# Patient Record
Sex: Male | Born: 1970 | Race: Black or African American | Hispanic: No | Marital: Single | Smoking: Current every day smoker
Health system: Southern US, Community
[De-identification: ages and names within clinical notes are randomized; demographics above are authoritative.]

---

## 2005-06-20 ENCOUNTER — Emergency Department (HOSPITAL_COMMUNITY): Admission: EM | Admit: 2005-06-20 | Discharge: 2005-06-20 | Payer: Self-pay | Admitting: Family Medicine

## 2007-04-06 ENCOUNTER — Emergency Department (HOSPITAL_COMMUNITY): Admission: EM | Admit: 2007-04-06 | Discharge: 2007-04-06 | Payer: Self-pay | Admitting: Emergency Medicine

## 2012-10-27 ENCOUNTER — Emergency Department (HOSPITAL_COMMUNITY)
Admission: EM | Admit: 2012-10-27 | Discharge: 2012-10-27 | Disposition: A | Discharge status: Home / Self Care | Payer: Self-pay | Attending: Emergency Medicine | Admitting: Emergency Medicine

## 2012-10-27 ENCOUNTER — Emergency Department (HOSPITAL_COMMUNITY): Payer: Self-pay

## 2012-10-27 ENCOUNTER — Encounter (HOSPITAL_COMMUNITY): Payer: Self-pay | Admitting: Emergency Medicine

## 2012-10-27 DIAGNOSIS — S91309A Unspecified open wound, unspecified foot, initial encounter: Secondary | ICD-10-CM | POA: Insufficient documentation

## 2012-10-27 DIAGNOSIS — Z23 Encounter for immunization: Secondary | ICD-10-CM | POA: Insufficient documentation

## 2012-10-27 DIAGNOSIS — Y939 Activity, unspecified: Secondary | ICD-10-CM | POA: Insufficient documentation

## 2012-10-27 DIAGNOSIS — F17200 Nicotine dependence, unspecified, uncomplicated: Secondary | ICD-10-CM | POA: Insufficient documentation

## 2012-10-27 DIAGNOSIS — S91311A Laceration without foreign body, right foot, initial encounter: Secondary | ICD-10-CM

## 2012-10-27 MED ORDER — LIDOCAINE-EPINEPHRINE 2 %-1:100000 IJ SOLN
20.0000 mL | Freq: Once | INTRAMUSCULAR | Status: AC
  Administered 2012-10-27: 20 mL

## 2012-10-27 MED ORDER — FENTANYL CITRATE 0.05 MG/ML IJ SOLN
100.0000 ug | Freq: Once | INTRAMUSCULAR | Status: AC
  Administered 2012-10-27: 100 ug via NASAL
  Filled 2012-10-27: qty 2

## 2012-10-27 MED ORDER — OXYCODONE-ACETAMINOPHEN 5-325 MG PO TABS
2.0000 | ORAL_TABLET | Freq: Once | ORAL | Status: AC
  Administered 2012-10-27: 2 via ORAL
  Filled 2012-10-27: qty 2

## 2012-10-27 MED ORDER — CEPHALEXIN 500 MG PO CAPS: ORAL_CAPSULE | ORAL | Status: DC

## 2012-10-27 MED ORDER — TETANUS-DIPHTH-ACELL PERTUSSIS 5-2.5-18.5 LF-MCG/0.5 IM SUSP
0.5000 mL | Freq: Once | INTRAMUSCULAR | Status: AC
  Administered 2012-10-27: 0.5 mL via INTRAMUSCULAR
  Filled 2012-10-27: qty 0.5

## 2012-10-27 MED ORDER — OXYCODONE-ACETAMINOPHEN 5-325 MG PO TABS: 2.0000 | ORAL_TABLET | Freq: Four times a day (QID) | ORAL | Status: DC | PRN

## 2012-10-27 NOTE — ED Notes (Signed)
PA's at bedside with suture cart
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
## 2012-10-27 NOTE — ED Notes (Signed)
Suture cart at bedside 

## 2012-10-27 NOTE — ED Provider Notes (Signed)
LACERATION REPAIR Performed by: Antony Madura Authorized by: Antony Madura Consent: Verbal consent obtained. Risks and benefits: risks, benefits and alternatives were discussed Consent given by: patient Patient identity confirmed: provided demographic data Prepped and Draped in normal sterile fashion Wound explored  Laceration Location: medial right foot  Laceration Length: approx 18 cm  No Foreign Bodies seen or palpated  Anesthesia: local infiltration  Local anesthetic: lidocaine 2% with epinephrine  Anesthetic total: 20 ml  Irrigation method: irrigated with sterile water Amount of cleaning: standard  Skin closure: non-absorbable ethilon 3-0 black nylon monofilament sutures  Number of sutures: 14  Technique: laceration was closed with horizontal mattress (4) and simple interrupted (10) sutures using a clean technique.  Patient tolerance: Patient tolerated the procedure well with no immediate complications.   Antony Madura, PA-C 10/27/12 2153  Antony Madura, PA-C 10/27/12 2156

## 2012-11-04 NOTE — ED Provider Notes (Signed)
Medical screening examination/treatment/procedure(s) were conducted as a shared visit with non-physician practitioner(s) and myself.  I personally evaluated the patient during the encounter  Hurman Horn, MD 11/04/12 [phone]

## 2012-11-12 ENCOUNTER — Encounter (HOSPITAL_COMMUNITY): Payer: Self-pay | Admitting: *Deleted

## 2012-11-12 ENCOUNTER — Emergency Department (HOSPITAL_COMMUNITY)
Admission: EM | Admit: 2012-11-12 | Discharge: 2012-11-13 | Disposition: A | Discharge status: Home / Self Care | Payer: Self-pay | Attending: Emergency Medicine | Admitting: Emergency Medicine

## 2012-11-12 ENCOUNTER — Emergency Department (HOSPITAL_COMMUNITY): Payer: Self-pay

## 2012-11-12 DIAGNOSIS — S91309A Unspecified open wound, unspecified foot, initial encounter: Secondary | ICD-10-CM | POA: Insufficient documentation

## 2012-11-12 DIAGNOSIS — T798XXA Other early complications of trauma, initial encounter: Secondary | ICD-10-CM

## 2012-11-12 DIAGNOSIS — F17200 Nicotine dependence, unspecified, uncomplicated: Secondary | ICD-10-CM | POA: Insufficient documentation

## 2012-11-12 MED ORDER — CLINDAMYCIN PHOSPHATE 600 MG/50ML IV SOLN
600.0000 mg | Freq: Once | INTRAVENOUS | Status: AC
  Administered 2012-11-13: 600 mg via INTRAVENOUS
  Filled 2012-11-12: qty 50
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
## 2012-11-12 NOTE — ED Notes (Signed)
Pt complain of right ankle pain 7/10. Pts foot is wrapped in kerlex.

## 2012-11-12 NOTE — ED Notes (Signed)
MD at bedside. 

## 2012-11-13 LAB — CBC WITH DIFFERENTIAL/PLATELET
Basophils Absolute: 0 10*3/uL (ref 0.0–0.1)
Basophils Relative: 0 % (ref 0–1)
Eosinophils Absolute: 0.2 10*3/uL (ref 0.0–0.7)
Hemoglobin: 13.6 g/dL (ref 13.0–17.0)
MCH: 31.3 pg (ref 26.0–34.0)
MCHC: 33.1 g/dL (ref 30.0–36.0)
Neutro Abs: 4.9 10*3/uL (ref 1.7–7.7)
Neutrophils Relative %: 60 % (ref 43–77)
Platelets: 255 10*3/uL (ref 150–400)
RDW: 14.1 % (ref 11.5–15.5)

## 2012-11-13 LAB — BASIC METABOLIC PANEL
Calcium: 8.9 mg/dL (ref 8.4–10.5)
GFR calc Af Amer: >90 mL/min (ref >90)
GFR calc non Af Amer: >90 mL/min (ref >90)
Potassium: 3.6 mEq/L (ref 3.5–5.1)
Sodium: 137 mEq/L (ref 135–145)

## 2012-11-13 LAB — SEDIMENTATION RATE: Sed Rate: 35 mm/hr — ABNORMAL HIGH (ref 0–16)

## 2012-11-13 MED ORDER — OXYCODONE-ACETAMINOPHEN 5-325 MG PO TABS: 2.0000 | ORAL_TABLET | Freq: Four times a day (QID) | ORAL | Status: DC | PRN

## 2012-11-13 MED ORDER — HYDROMORPHONE HCL PF 1 MG/ML IJ SOLN
1.0000 mg | Freq: Once | INTRAMUSCULAR | Status: AC
  Administered 2012-11-13: 1 mg via INTRAVENOUS
  Filled 2012-11-13: qty 1

## 2012-11-13 MED ORDER — CLINDAMYCIN HCL 150 MG PO CAPS: 300.0000 mg | ORAL_CAPSULE | Freq: Four times a day (QID) | ORAL | Status: DC

## 2012-11-13 MED ORDER — MORPHINE SULFATE 4 MG/ML IJ SOLN
4.0000 mg | Freq: Once | INTRAMUSCULAR | Status: AC
  Administered 2012-11-13: 4 mg via INTRAVENOUS
  Filled 2012-11-13: qty 1
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
## 2012-11-19 LAB — CULTURE, BLOOD (ROUTINE X 2)
Culture: NO GROWTH
Special Requests: NORMAL

## 2012-11-28 ENCOUNTER — Encounter (HOSPITAL_BASED_OUTPATIENT_CLINIC_OR_DEPARTMENT_OTHER): Payer: Self-pay | Attending: General Surgery

## 2012-11-28 DIAGNOSIS — S81809A Unspecified open wound, unspecified lower leg, initial encounter: Secondary | ICD-10-CM | POA: Insufficient documentation

## 2012-11-28 DIAGNOSIS — F17200 Nicotine dependence, unspecified, uncomplicated: Secondary | ICD-10-CM | POA: Insufficient documentation

## 2012-11-28 DIAGNOSIS — S81009A Unspecified open wound, unspecified knee, initial encounter: Secondary | ICD-10-CM | POA: Insufficient documentation
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
## 2012-12-07 ENCOUNTER — Other Ambulatory Visit (HOSPITAL_COMMUNITY): Payer: Self-pay | Admitting: General Surgery

## 2012-12-07 DIAGNOSIS — I872 Venous insufficiency (chronic) (peripheral): Secondary | ICD-10-CM

## 2012-12-07 DIAGNOSIS — T07XXXA Unspecified multiple injuries, initial encounter: Secondary | ICD-10-CM

## 2012-12-12 ENCOUNTER — Ambulatory Visit (HOSPITAL_COMMUNITY)
Admission: RE | Admit: 2012-12-12 | Discharge: 2012-12-12 | Disposition: A | Discharge status: Home / Self Care | Payer: Self-pay | Source: Ambulatory Visit | Attending: Cardiovascular Disease | Admitting: Cardiovascular Disease

## 2012-12-12 DIAGNOSIS — T07XXXA Unspecified multiple injuries, initial encounter: Secondary | ICD-10-CM

## 2012-12-12 DIAGNOSIS — T148XXA Other injury of unspecified body region, initial encounter: Secondary | ICD-10-CM | POA: Insufficient documentation

## 2012-12-12 DIAGNOSIS — X58XXXA Exposure to other specified factors, initial encounter: Secondary | ICD-10-CM | POA: Insufficient documentation

## 2012-12-12 NOTE — Progress Notes (Signed)
Arterial Duplex Completed. Ngozi Alvidrez D  

## 2012-12-24 ENCOUNTER — Encounter (HOSPITAL_COMMUNITY): Payer: Self-pay

## 2012-12-26 ENCOUNTER — Encounter (HOSPITAL_BASED_OUTPATIENT_CLINIC_OR_DEPARTMENT_OTHER): Payer: Self-pay | Attending: General Surgery

## 2012-12-26 DIAGNOSIS — T8189XA Other complications of procedures, not elsewhere classified, initial encounter: Secondary | ICD-10-CM | POA: Insufficient documentation

## 2012-12-26 DIAGNOSIS — Y838 Other surgical procedures as the cause of abnormal reaction of the patient, or of later complication, without mention of misadventure at the time of the procedure: Secondary | ICD-10-CM | POA: Insufficient documentation

## 2013-01-30 ENCOUNTER — Encounter (HOSPITAL_BASED_OUTPATIENT_CLINIC_OR_DEPARTMENT_OTHER): Payer: No Typology Code available for payment source | Attending: General Surgery

## 2017-09-28 ENCOUNTER — Ambulatory Visit (INDEPENDENT_AMBULATORY_CARE_PROVIDER_SITE_OTHER): Payer: No Typology Code available for payment source

## 2017-09-28 ENCOUNTER — Encounter: Payer: Self-pay | Admitting: Urgent Care

## 2017-09-28 ENCOUNTER — Ambulatory Visit (INDEPENDENT_AMBULATORY_CARE_PROVIDER_SITE_OTHER): Payer: No Typology Code available for payment source | Admitting: Urgent Care

## 2017-09-28 VITALS — BP 110/70 | HR 102 | Temp 99.2°F | Resp 18 | Ht 72.0 in | Wt 294.2 lb

## 2017-09-28 DIAGNOSIS — R070 Pain in throat: Secondary | ICD-10-CM

## 2017-09-28 DIAGNOSIS — R05 Cough: Secondary | ICD-10-CM

## 2017-09-28 DIAGNOSIS — R059 Cough, unspecified: Secondary | ICD-10-CM

## 2017-09-28 DIAGNOSIS — F17200 Nicotine dependence, unspecified, uncomplicated: Secondary | ICD-10-CM

## 2017-09-28 DIAGNOSIS — R5383 Other fatigue: Secondary | ICD-10-CM

## 2017-09-28 LAB — POCT INFLUENZA A/B
INFLUENZA A, POC: NEGATIVE
INFLUENZA B, POC: NEGATIVE

## 2017-09-28 MED ORDER — HYDROCODONE-HOMATROPINE 5-1.5 MG/5ML PO SYRP: 5.0000 mL | ORAL_SOLUTION | Freq: Every evening | ORAL | 0 refills | Status: DC | PRN

## 2017-09-28 MED ORDER — PSEUDOEPHEDRINE HCL ER 120 MG PO TB12: 120.0000 mg | ORAL_TABLET | Freq: Two times a day (BID) | ORAL | 3 refills | Status: DC

## 2017-09-28 MED ORDER — BENZONATATE 100 MG PO CAPS: 100.0000 mg | ORAL_CAPSULE | Freq: Three times a day (TID) | ORAL | 0 refills | Status: DC | PRN

## 2017-09-28 NOTE — Patient Instructions (Addendum)
Cough, Adult A cough helps to clear your throat and lungs. A cough may last only 2-3 weeks (acute), or it may last longer than 8 weeks (chronic). Many different things can cause a cough. A cough may be a sign of an illness or another medical condition. Follow these instructions at home:  Pay attention to any changes in your cough.  Take medicines only as told by your doctor. ? If you were prescribed an antibiotic medicine, take it as told by your doctor. Do not stop taking it even if you start to feel better. ? Talk with your doctor before you try using a cough medicine.  Drink enough fluid to keep your pee (urine) clear or pale yellow.  If the air is dry, use a cold steam vaporizer or humidifier in your home.  Stay away from things that make you cough at work or at home.  If your cough is worse at night, try using extra pillows to raise your head up higher while you sleep.  Do not smoke, and try not to be around smoke. If you need help quitting, ask your doctor.  Do not have caffeine.  Do not drink alcohol.  Rest as needed. Contact a doctor if:  You have new problems (symptoms).  You cough up yellow fluid (pus).  Your cough does not get better after 2-3 weeks, or your cough gets worse.  Medicine does not help your cough and you are not sleeping well.  You have pain that gets worse or pain that is not helped with medicine.  You have a fever.  You are losing weight and you do not know why.  You have night sweats. Get help right away if:  You cough up blood.  You have trouble breathing.  Your heartbeat is very fast. This information is not intended to replace advice given to you by your health care provider. Make sure you discuss any questions you have with your health care provider. Document Released: 05/26/2011 Document Revised: 02/18/2016 Document Reviewed: 11/19/2014 Elsevier Interactive Patient Education  2018 Elsevier Inc.     IF you received an x-ray  today, you will receive an invoice from [facility]. Please contact [facility] at [phone] with questions or concerns regarding your invoice.   IF you received labwork today, you will receive an invoice from [facility]. Please contact [facility] at [phone] with questions or concerns regarding your invoice.   Our billing staff will not be able to assist you with questions regarding bills from these companies.  You will be contacted with the lab results as soon as they are available. The fastest way to get your results is to activate your My Chart account. Instructions are located on the last page of this paperwork. If you have not heard from us regarding the results in 2 weeks, please contact this office.      

## 2017-09-28 NOTE — Progress Notes (Signed)
  MRN: [id number] DOB: 12/15/1970  Subjective:   Nada LibmanVera Lehn is a 47 y.o. male presenting for 3 week history of persistent productive cough that occasionally elicits chest pain. Has also had scratchy throat intermittently, subjective fever, fatigue. Denies fever, sinus pain, sinus congestion, ear pain, sore throat, shob, wheezing, n/v, abdominal pain. Smokes 1ppd. Denies history of COPD, asthma.   Dwana CurdVera is not currently taking any medications and has No Known Allergies.  Dwana CurdVera denies past medical and surgical history. Family history is positive for stroke in both mother and father.  Objective:   Vitals: BP 110/70   Pulse (!) 102   Temp 99.2 F (37.3 C) (Oral)   Resp 18   Ht 6' (1.829 m)   Wt 294 lb 3.2 oz (133.4 kg)   SpO2 98%   BMI 39.90 kg/m   Physical Exam  Constitutional: He is oriented to person, place, and time. He appears well-developed and well-nourished.  HENT:  TM's intact bilaterally, no effusions or erythema. Nasal turbinates pink, dry, nasal passages patent. No sinus tenderness. Oropharynx moderate post-nasal drainage, mucous membranes moist.    Eyes: Right eye exhibits no discharge. Left eye exhibits no discharge.  Neck: Normal range of motion. Neck supple.  Cardiovascular: Normal rate, regular rhythm and intact distal pulses. Exam reveals no gallop and no friction rub.  No murmur heard. Pulmonary/Chest: No respiratory distress. He has no wheezes. He has no rales.  Lymphadenopathy:    He has no cervical adenopathy.  Neurological: He is alert and oriented to person, place, and time.  Skin: Skin is warm and dry.  Psychiatric: He has a normal mood and affect.   Results for orders placed or performed in visit on 09/28/17 (from the past 24 hour(s))  POCT Influenza A/B     Status: None   Collection Time: 09/28/17  8:41 AM  Result Value Ref Range   Influenza A, POC Negative Negative   Influenza B, POC Negative Negative   Dg Chest 2 View  Result Date:  09/28/2017 CLINICAL DATA:  Cough and fever EXAM: CHEST  2 VIEW COMPARISON:  None in PACs FINDINGS: The lungs are adequately inflated. There is no focal infiltrate. There is no pleural effusion. The heart and pulmonary vascularity are normal. The mediastinum is normal in width. The bony thorax exhibits no acute abnormality. IMPRESSION: There is no pneumonia nor other acute cardiopulmonary abnormality. Electronically Signed   By: David  SwazilandJordan M.D.   On: 09/28/2017 08:32    Assessment and Plan :   Cough - Plan: POCT Influenza A/B, DG Chest 2 View  Throat discomfort  Tobacco use disorder   Will use supportive care. Recommended patient stop smoking at least until symptoms improve.   Wallis BambergMario Joandy Burget, PA-C [facility] at [facility] [phone] 09/28/2017  8:20 AM

## 2018-02-08 ENCOUNTER — Encounter: Payer: Self-pay | Admitting: Urgent Care

## 2018-02-08 ENCOUNTER — Ambulatory Visit (INDEPENDENT_AMBULATORY_CARE_PROVIDER_SITE_OTHER): Payer: No Typology Code available for payment source | Admitting: Urgent Care

## 2018-02-08 VITALS — HR 87 | Temp 98.6°F | Resp 18 | Ht 72.0 in | Wt 306.0 lb

## 2018-02-08 DIAGNOSIS — R202 Paresthesia of skin: Secondary | ICD-10-CM | POA: Diagnosis not present

## 2018-02-08 DIAGNOSIS — Z720 Tobacco use: Secondary | ICD-10-CM | POA: Diagnosis not present

## 2018-02-08 DIAGNOSIS — Z0000 Encounter for general adult medical examination without abnormal findings: Secondary | ICD-10-CM

## 2018-02-08 MED ORDER — NICOTINE 21 MG/24HR TD PT24: 21.0000 mg | MEDICATED_PATCH | Freq: Every day | TRANSDERMAL | 0 refills | Status: AC

## 2018-02-08 NOTE — Progress Notes (Signed)
MRN: [id number]  Subjective:   [name] is a 47 y.o. male presenting for annual physical exam. Patient is single, has 1 adult child. Works as an Firefighter. Has good relationships at home, has a good support network. Smokes 1ppd, has 30 pack year history. Has ~7 drinks of alcohol per week.   Medical care team includes: PCP: Gale Journey, MD Vision: No visual deficits. Dental: Patient is due for cleanings.  Specialists: None.   Health Maintenance: Last tdap was 10/27/2012. HIV screen performed today.   Numbness - Reports 3 day history of right hand tingling mostly at his finger tips but has mild tingling of his right palm. As an Firefighter, he has had to use his hand a lot more, started using a computer to type. Also sleep with his right hand folded under his chin. Wakes up intermittently with right hand tingling. Has not tried medications for relief.   Tony Welch is not currently taking any medications and has No Known Allergies. Tony Welch denies past medical and surgical history. Has family history includes Heart disease in his father; Stroke in his father.  Review of Systems  Constitutional: Negative for chills, diaphoresis, fever, malaise/fatigue and weight loss.  HENT: Negative for congestion, ear discharge, ear pain, hearing loss, nosebleeds, sore throat and tinnitus.   Eyes: Negative for blurred vision, double vision, photophobia, pain, discharge and redness.  Respiratory: Negative for cough, shortness of breath and wheezing.   Cardiovascular: Negative for chest pain, palpitations and leg swelling.  Gastrointestinal: Negative for abdominal pain, blood in stool, constipation, diarrhea, nausea and vomiting.  Genitourinary: Negative for dysuria, flank pain, frequency, hematuria and urgency.  Musculoskeletal: Negative for back pain, joint pain and myalgias.  Skin: Negative for itching and rash.  Neurological: Positive for tingling (as in HPI). Negative for dizziness,  seizures, loss of consciousness, weakness and headaches.  Endo/Heme/Allergies: Negative for polydipsia.  Psychiatric/Behavioral: Negative for depression, hallucinations, memory loss, substance abuse and suicidal ideas. The patient is not nervous/anxious and does not have insomnia.    Objective:   Vitals: Pulse 87   Temp 98.6 F (37 C) (Oral)   Resp 18   Ht 6' (1.829 m)   Wt (!) 306 lb (138.8 kg)   SpO2 98%   BMI 41.50 kg/m   Physical Exam  Constitutional: He is oriented to person, place, and time. He appears well-developed and well-nourished.  Body habitus is morbidly obese.  HENT:  TM's intact bilaterally, no effusions or erythema. Nasal turbinates pink and moist, nasal passages patent. No sinus tenderness. Oropharynx clear, mucous membranes moist, dentition in good repair.  Eyes: Pupils are equal, round, and reactive to light. Conjunctivae and EOM are normal. Right eye exhibits no discharge. Left eye exhibits no discharge. No scleral icterus.  Neck: Normal range of motion. Neck supple. No thyromegaly present.  Cardiovascular: Normal rate, regular rhythm and intact distal pulses. Exam reveals no gallop and no friction rub.  No murmur heard. Pulmonary/Chest: No stridor. No respiratory distress. He has no wheezes. He has no rales.  Abdominal: Soft. Bowel sounds are normal. He exhibits no distension and no mass. There is no tenderness.  Musculoskeletal: Normal range of motion. He exhibits no edema or tenderness.  Lymphadenopathy:    He has no cervical adenopathy.  Neurological: He is alert and oriented to person, place, and time. He has normal reflexes.  Skin: Skin is warm and dry. No rash noted. No erythema. No pallor.  Psychiatric: He has a normal mood and  affect.   Assessment and Plan :   Annual physical exam - Plan: HIV antibody, CBC, Comprehensive metabolic panel, Lipid panel, TSH  Right hand paresthesia - Plan: Hemoglobin A1c  Morbid obesity (HCC)  Tobacco  use  Patient is medically stable, labs pending. Discussed healthy lifestyle, diet, exercise, preventative care, vaccinations, and addressed patient's concerns.  Patient will try using a wrist splint to address his right hand tingling of his fingertips and palm.  I recommended we try and modify his work activities but patient was reluctant to do this.  Labs pending for check on his diabetes level.  We also discussed smoking cessation.  He agreed to start NicoDerm patches and follow-up in 6 weeks.    Wallis Bamberg, PA-C Primary Care at [facility] [phone] 02/08/2018  12:17 PM

## 2018-02-08 NOTE — Patient Instructions (Addendum)
Salads - kale, spinach, cabbage, spring mix; use seeds like pumpkin seeds or sunflower seeds; you can also use 1-2 hard boiled eggs. Fruits - avocadoes, berries (blueberries, raspberries, blackberries), apples, oranges, pomegranate, grapefruit Seeds - quinoa, chia seeds; you can also incorporate oatmeal Vegetables - aspargus, cauliflower, broccoli, green beans, brussel spouts, bell peppers; stay away from starchy vegetables like potatoes, carrots, peas  Brussel sprouts - Cut off stems. Place in a mixing bowl that has a lid. Pour in a 1/4-1/2 cup olive oil, spices, use a light amount of parmesan. Place on a baking sheet. Bake for 10 minutes at 400F. Take it out, eat the brussel chips. Place for another 5-10 minutes.   Mashed cauliflower - Boil a bunch of cauliflower in a pot of water. Blend in a food processor with 1-2 tablespoons of butter.  Spaghetti squash -  Cut the squash in half very carefully, clean out seeds from the middle. Place 1/2 face down in a microwave safe dish with at least 2 inches of water. Make 4-6 slits on outside of spaghetti squash and microwave for 10-12 minutes. Take out the spaghetti using a metal spoon. Repeat for the other half.   Vega protein is good protein powder, make sure you use ~6 ice cubes to give it smoothie consistency together with ~4-6 ounces of vanilla soy milk. Throw cinnamon into your shake, use peanut butter. You can also use the fruits that I listed above. Throw spinach or kale into the shake.      Please make sure that you are eating less or limit your carbohydrates such as white rice, potatoes, white breads, pasta, pizza, sodas, beer, sweet tea, fruit juices. Exercise can also help with your blood sugar. You can instead eat more salads, fruits, vegetables, whole grains, wheat, oats.      Health Maintenance, Male A healthy lifestyle and preventive care is important for your health and wellness. Ask your health care provider about what schedule of  regular examinations is right for you. What should I know about weight and diet? Eat a Healthy Diet  Eat plenty of vegetables, fruits, whole grains, low-fat dairy products, and lean protein.  Do not eat a lot of foods high in solid fats, added sugars, or salt.  Maintain a Healthy Weight Regular exercise can help you achieve or maintain a healthy weight. You should:  Do at least 150 minutes of exercise each week. The exercise should increase your heart rate and make you sweat (moderate-intensity exercise).  Do strength-training exercises at least twice a week.  Watch Your Levels of Cholesterol and Blood Lipids  Have your blood tested for lipids and cholesterol every 5 years starting at 49 years of age. If you are at high risk for heart disease, you should start having your blood tested when you are 49 years old. You may need to have your cholesterol levels checked more often if: ? Your lipid or cholesterol levels are high. ? You are older than 49 years of age. ? You are at high risk for heart disease.  What should I know about cancer screening? Many types of cancers can be detected early and may often be prevented. Lung Cancer  You should be screened every year for lung cancer if: ? You are a current smoker who has smoked for at least 30 years. ? You are a former smoker who has quit within the past 15 years.  Talk to your health care provider about your screening options, when you should start  screening, and how often you should be screened.  Colorectal Cancer  Routine colorectal cancer screening usually begins at 49 years of age and should be repeated every 5-10 years until you are 49 years old. You may need to be screened more often if early forms of precancerous polyps or small growths are found. Your health care provider may recommend screening at an earlier age if you have risk factors for colon cancer.  Your health care provider may recommend using home test kits to check for  hidden blood in the stool.  A small camera at the end of a tube can be used to examine your colon (sigmoidoscopy or colonoscopy). This checks for the earliest forms of colorectal cancer.  Prostate and Testicular Cancer  Depending on your age and overall health, your health care provider may do certain tests to screen for prostate and testicular cancer.  Talk to your health care provider about any symptoms or concerns you have about testicular or prostate cancer.  Skin Cancer  Check your skin from head to toe regularly.  Tell your health care provider about any new moles or changes in moles, especially if: ? There is a change in a mole's size, shape, or color. ? You have a mole that is larger than a pencil eraser.  Always use sunscreen. Apply sunscreen liberally and repeat throughout the day.  Protect yourself by wearing long sleeves, pants, a wide-brimmed hat, and sunglasses when outside.  What should I know about heart disease, diabetes, and high blood pressure?  If you are 43-19 years of age, have your blood pressure checked every 3-5 years. If you are 15 years of age or older, have your blood pressure checked every year. You should have your blood pressure measured twice-once when you are at a hospital or clinic, and once when you are not at a hospital or clinic. Record the average of the two measurements. To check your blood pressure when you are not at a hospital or clinic, you can use: ? An automated blood pressure machine at a pharmacy. ? A home blood pressure monitor.  Talk to your health care provider about your target blood pressure.  If you are between 42-53 years old, ask your health care provider if you should take aspirin to prevent heart disease.  Have regular diabetes screenings by checking your fasting blood sugar level. ? If you are at a normal weight and have a low risk for diabetes, have this test once every three years after the age of 27. ? If you are  overweight and have a high risk for diabetes, consider being tested at a younger age or more often.  A one-time screening for abdominal aortic aneurysm (AAA) by ultrasound is recommended for men aged 65-75 years who are current or former smokers. What should I know about preventing infection? Hepatitis B If you have a higher risk for hepatitis B, you should be screened for this virus. Talk with your health care provider to find out if you are at risk for hepatitis B infection. Hepatitis C Blood testing is recommended for:  Everyone born from [birth year] through 1967.  Anyone with known risk factors for hepatitis C.  Sexually Transmitted Diseases (STDs)  You should be screened each year for STDs including gonorrhea and chlamydia if: ? You are sexually active and are younger than 49 years of age. ? You are older than 49 years of age and your health care provider tells you that you are at risk for  this type of infection. ? Your sexual activity has changed since you were last screened and you are at an increased risk for chlamydia or gonorrhea. Ask your health care provider if you are at risk.  Talk with your health care provider about whether you are at high risk of being infected with HIV. Your health care provider may recommend a prescription medicine to help prevent HIV infection.  What else can I do?  Schedule regular health, dental, and eye exams.  Stay current with your vaccines (immunizations).  Do not use any tobacco products, such as cigarettes, chewing tobacco, and e-cigarettes. If you need help quitting, ask your health care provider.  Limit alcohol intake to no more than 2 drinks per day. One drink equals 12 ounces of beer, 5 ounces of wine, or 1 ounces of hard liquor.  Do not use street drugs.  Do not share needles.  Ask your health care provider for help if you need support or information about quitting drugs.  Tell your health care provider if you often feel  depressed.  Tell your health care provider if you have ever been abused or do not feel safe at home. This information is not intended to replace advice given to you by your health care provider. Make sure you discuss any questions you have with your health care provider. Document Released: 03/10/2008 Document Revised: 05/11/2016 Document Reviewed: 06/16/2015 Elsevier Interactive Patient Education  2018 [employer].     Smoking Tobacco Information Smoking tobacco will very likely harm your health. Tobacco contains a poisonous (toxic), colorless chemical called nicotine. Nicotine affects the brain and makes tobacco addictive. This change in your brain can make it hard to stop smoking. Tobacco also has other toxic chemicals that can hurt your body and raise your risk of many cancers. How can smoking tobacco affect me? Smoking tobacco can increase your chances of having serious health conditions, such as:  Cancer. Smoking is most commonly associated with lung cancer, but can lead to cancer in other parts of the body.  Chronic obstructive pulmonary disease (COPD). This is a long-term lung condition that makes it hard to breathe. It also gets worse over time.  High blood pressure (hypertension), heart disease, stroke, or heart attack.  Lung infections, such as pneumonia.  Cataracts. This is when the lenses in the eyes become clouded.  Digestive problems. This may include peptic ulcers, heartburn, and gastroesophageal reflux disease (GERD).  Oral health problems, such as gum disease and tooth loss.  Loss of taste and smell.  Smoking can affect your appearance by causing:  Wrinkles.  Yellow or stained teeth, fingers, and fingernails.  Smoking tobacco can also affect your social life.  Many workplaces, [employer], hotels, and public places are tobacco-free. This means that you may experience challenges in finding places to smoke when away from home.  The cost of a smoking habit can  be expensive. Expenses for someone who smokes come in two ways: ? You spend money on a regular basis to buy tobacco. ? Your health care costs in the long-term are higher if you smoke.  Tobacco smoke can also affect the health of those around you. Children of smokers have greater chances of: ? Sudden infant death syndrome (SIDS). ? Ear infections. ? Lung infections.  What lifestyle changes can be made?  Do not start smoking. Quit if you already do.  To quit smoking: ? Make a plan to quit smoking and commit yourself to it. Look for programs to help  you and ask your health care provider for recommendations and ideas. ? Talk with your health care provider about using nicotine replacement medicines to help you quit. Medicine replacement medicines include gum, lozenges, patches, sprays, or pills. ? Do not replace cigarette smoking with electronic cigarettes, which are commonly called e-cigarettes. The safety of e-cigarettes is not known, and some may contain harmful chemicals. ? Avoid places, people, or situations that tempt you to smoke. ? If you try to quit but return to smoking, don't give up hope. It is very common for people to try a number of times before they fully succeed. When you feel ready again, give it another try.  Quitting smoking might affect the way you eat as well as your weight. Be prepared to monitor your eating habits. Get support in planning and following a healthy diet.  Ask your health care provider about having regular tests (screenings) to check for cancer. This may include blood tests, imaging tests, and other tests.  Exercise regularly. Consider taking walks, joining a gym, or doing yoga or exercise classes.  Develop skills to manage your stress. These skills include meditation. What are the benefits of quitting smoking? By quitting smoking, you may:  Lower your risk of getting cancer and other diseases caused by smoking.  Live longer.  Breathe better.  Lower  your blood pressure and heart rate.  Stop your addiction to tobacco.  Stop creating secondhand smoke that hurts other people.  Improve your sense of taste and smell.  Look better over time, due to having fewer wrinkles and less staining.  What can happen if changes are not made? If you do not stop smoking, you may:  Get cancer and other diseases.  Develop COPD or other long-term (chronic) lung conditions.  Develop serious problems with your heart and blood vessels (cardiovascular system).  Need more tests to screen for problems caused by smoking.  Have higher, long-term healthcare costs from medicines or treatments related to smoking.  Continue to have worsening changes in your lungs, mouth, and nose.  Where to find support: To get support to quit smoking, consider:  Asking your health care provider for more information and resources.  Taking classes to learn more about quitting smoking.  Looking for local organizations that offer resources about quitting smoking.  Joining a support group for people who want to quit smoking in your local community.  Where to find more information: You may find more information about quitting smoking from:  HelpGuide.org: www.helpguide.org/articles/addictions/how-to-quit-smoking.htm  [employer]: smokefree.gov  American Lung Association: www.lung.org  Contact a health care provider if:  You have problems breathing.  Your lips, nose, or fingers turn blue.  You have chest pain.  You are coughing up blood.  You feel faint or you pass out.  You have other noticeable changes that cause you to worry. Summary  Smoking tobacco can negatively affect your health, the health of those around you, your finances, and your social life.  Do not start smoking. Quit if you already do. If you need help quitting, ask your health care provider.  Think about joining a support group for people who want to quit smoking in your local  community. There are many effective programs that will help you to quit this behavior. This information is not intended to replace advice given to you by your health care provider. Make sure you discuss any questions you have with your health care provider. Document Released: 09/27/2016 Document Revised: 09/27/2016 Document Reviewed: 09/27/2016 Elsevier Interactive Patient  Education  [employer].     IF you received an x-ray today, you will receive an invoice from [facility]. Please contact [facility] at [phone] with questions or concerns regarding your invoice.   IF you received labwork today, you will receive an invoice from [facility]. Please contact [facility] at [phone] with questions or concerns regarding your invoice.   Our billing staff will not be able to assist you with questions regarding bills from these companies.  You will be contacted with the lab results as soon as they are available. The fastest way to get your results is to activate your My Chart account. Instructions are located on the last page of this paperwork. If you have not heard from [place] regarding the results in 2 weeks, please contact this office.

## 2018-02-09 LAB — COMPREHENSIVE METABOLIC PANEL
ALBUMIN: 4.3 g/dL (ref 3.5–5.5)
ALT: 18 IU/L (ref 0–44)
AST: 24 IU/L (ref 0–40)
Albumin/Globulin Ratio: 1.5 (ref 1.2–2.2)
Alkaline Phosphatase: 73 IU/L (ref 39–117)
BUN / CREAT RATIO: 18 (ref 9–20)
BUN: 21 mg/dL (ref 6–24)
Bilirubin Total: 0.3 mg/dL (ref 0.0–1.2)
CALCIUM: 9.5 mg/dL (ref 8.7–10.2)
CO2: 23 mmol/L (ref 20–29)
Chloride: 99 mmol/L (ref 96–106)
Creatinine, Ser: 1.14 mg/dL (ref 0.76–1.27)
GFR calc non Af Amer: 76 mL/min/{1.73_m2} (ref >59)
GFR, EST AFRICAN AMERICAN: 87 mL/min/{1.73_m2} (ref >59)
GLOBULIN, TOTAL: 2.9 g/dL (ref 1.5–4.5)
GLUCOSE: 87 mg/dL (ref 65–99)
Potassium: 4.1 mmol/L (ref 3.5–5.2)
Sodium: 138 mmol/L (ref 134–144)
TOTAL PROTEIN: 7.2 g/dL (ref 6.0–8.5)

## 2018-02-09 LAB — CBC
HEMATOCRIT: 43 % (ref 37.5–51.0)
HEMOGLOBIN: 14.5 g/dL (ref 13.0–17.7)
MCH: 31.5 pg (ref 26.6–33.0)
MCHC: 33.7 g/dL (ref 31.5–35.7)
MCV: 94 fL (ref 79–97)
Platelets: 237 10*3/uL (ref 150–379)
RBC: 4.6 x10E6/uL (ref 4.14–5.80)
RDW: 14.4 % (ref 12.3–15.4)
WBC: 8 10*3/uL (ref 3.4–10.8)

## 2018-02-09 LAB — LIPID PANEL
CHOL/HDL RATIO: 3.5 ratio (ref 0.0–5.0)
CHOLESTEROL TOTAL: 184 mg/dL (ref 100–199)
HDL: 53 mg/dL (ref >39)
LDL CALC: 90 mg/dL (ref 0–99)
Triglycerides: 203 mg/dL — ABNORMAL HIGH (ref 0–149)
VLDL CHOLESTEROL CAL: 41 mg/dL — AB (ref 5–40)

## 2018-02-09 LAB — HIV ANTIBODY (ROUTINE TESTING W REFLEX): HIV SCREEN 4TH GENERATION: NONREACTIVE

## 2018-02-09 LAB — HEMOGLOBIN A1C
Est. average glucose Bld gHb Est-mCnc: 120 mg/dL
HEMOGLOBIN A1C: 5.8 % — AB (ref 4.8–5.6)

## 2018-02-09 LAB — TSH: TSH: 2.92 u[IU]/mL (ref 0.450–4.500)

## 2019-02-23 IMAGING — DX DG CHEST 2V
2 series · 2 of 2 positions shown · non-contrast
Comparison: None in PACs

CLINICAL DATA: Cough and fever

EXAM:
CHEST  2 VIEW

[chest pa]
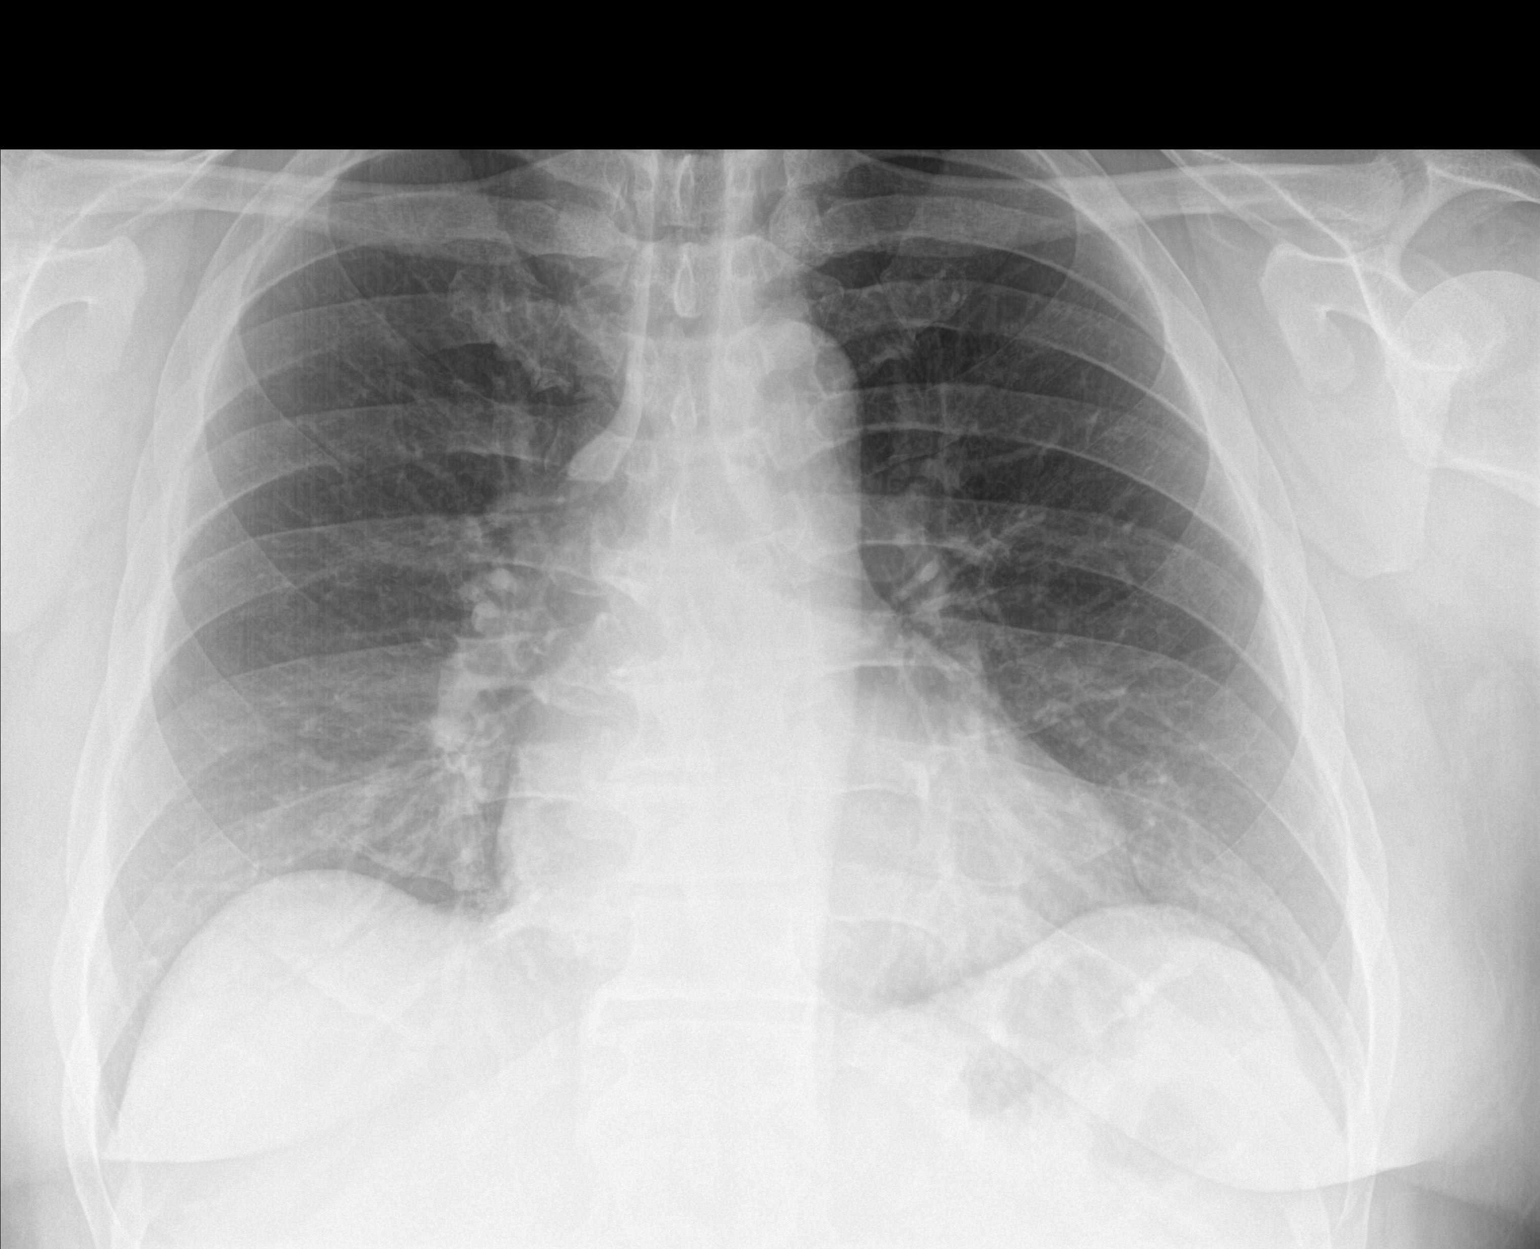

[chest lat]
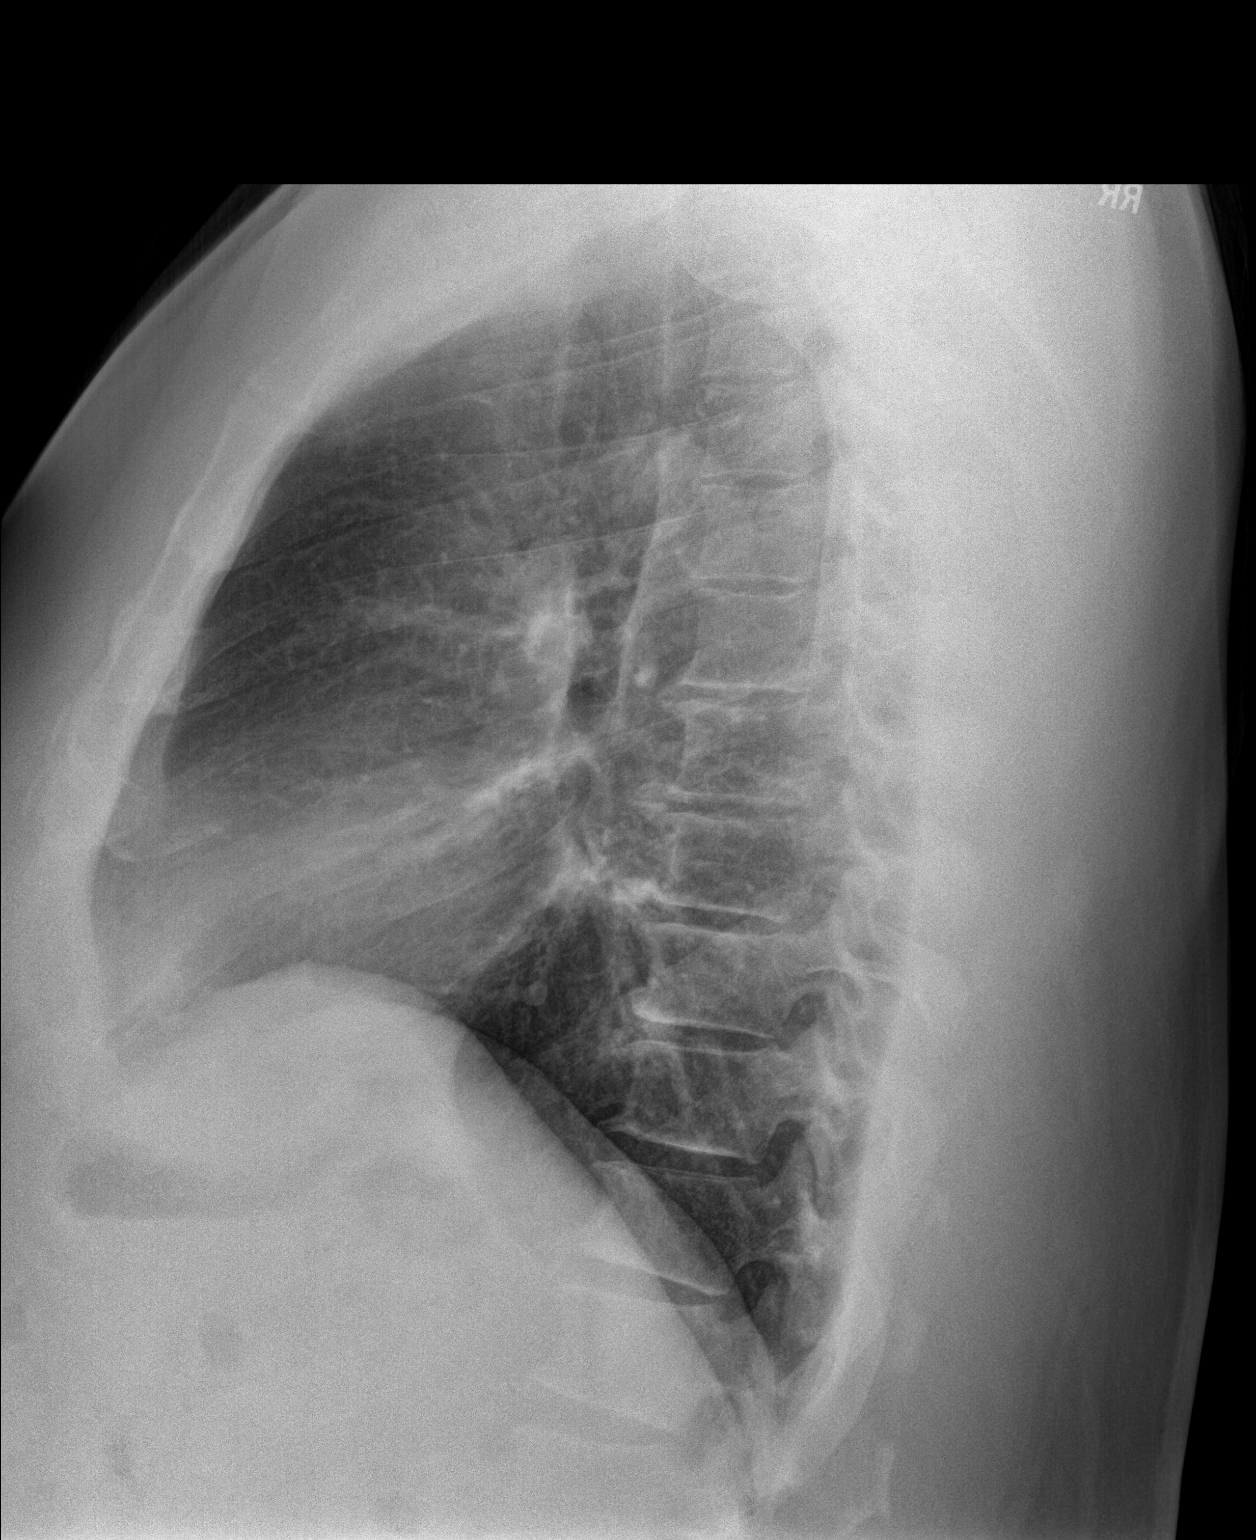

[2 of 2 positions shown; findings below may reference images not displayed]

FINDINGS: The lungs are adequately inflated. There is no focal infiltrate.
There is no pleural effusion. The heart and pulmonary vascularity
are normal. The mediastinum is normal in width. The bony thorax
exhibits no acute abnormality.
IMPRESSION: There is no pneumonia nor other acute cardiopulmonary abnormality.
